# Patient Record
Sex: Female | Born: 1995 | Race: White | Hispanic: No | Marital: Single | Smoking: Current every day smoker
Health system: Southern US, Community
[De-identification: ages and names within clinical notes are randomized; demographics above are authoritative.]

## PROBLEM LIST (undated history)

## (undated) DIAGNOSIS — G43909 Migraine, unspecified, not intractable, without status migrainosus: Secondary | ICD-10-CM

## (undated) DIAGNOSIS — I10 Essential (primary) hypertension: Secondary | ICD-10-CM

## (undated) DIAGNOSIS — E669 Obesity, unspecified: Secondary | ICD-10-CM

---
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
## 2015-08-27 ENCOUNTER — Emergency Department: Payer: 59

## 2015-08-27 ENCOUNTER — Encounter: Payer: Self-pay | Admitting: Emergency Medicine

## 2015-08-27 ENCOUNTER — Emergency Department
Admission: EM | Admit: 2015-08-27 | Discharge: 2015-08-27 | Disposition: A | Discharge status: Home / Self Care | Payer: 59 | Attending: Emergency Medicine | Admitting: Emergency Medicine

## 2015-08-27 DIAGNOSIS — F17200 Nicotine dependence, unspecified, uncomplicated: Secondary | ICD-10-CM | POA: Diagnosis not present

## 2015-08-27 DIAGNOSIS — G43909 Migraine, unspecified, not intractable, without status migrainosus: Secondary | ICD-10-CM | POA: Diagnosis present

## 2015-08-27 DIAGNOSIS — F329 Major depressive disorder, single episode, unspecified: Secondary | ICD-10-CM | POA: Insufficient documentation

## 2015-08-27 DIAGNOSIS — R51 Headache: Secondary | ICD-10-CM | POA: Diagnosis not present

## 2015-08-27 DIAGNOSIS — R519 Headache, unspecified: Secondary | ICD-10-CM

## 2015-08-27 HISTORY — DX: Migraine, unspecified, not intractable, without status migrainosus: G43.909

## 2015-08-27 LAB — COMPREHENSIVE METABOLIC PANEL
ALBUMIN: 3.9 g/dL (ref 3.5–5.0)
ALK PHOS: 63 U/L (ref 38–126)
ALT: 23 U/L (ref 14–54)
ANION GAP: 6 (ref 5–15)
AST: 19 U/L (ref 15–41)
BILIRUBIN TOTAL: 0.3 mg/dL (ref 0.3–1.2)
BUN: 14 mg/dL (ref 6–20)
CALCIUM: 9 mg/dL (ref 8.9–10.3)
CO2: 22 mmol/L (ref 22–32)
Chloride: 107 mmol/L (ref 101–111)
Creatinine, Ser: 0.66 mg/dL (ref 0.44–1.00)
GFR calc Af Amer: >60 mL/min (ref >60)
GFR calc non Af Amer: >60 mL/min (ref >60)
GLUCOSE: 90 mg/dL (ref 65–99)
POTASSIUM: 4.2 mmol/L (ref 3.5–5.1)
SODIUM: 135 mmol/L (ref 135–145)
TOTAL PROTEIN: 7.3 g/dL (ref 6.5–8.1)

## 2015-08-27 LAB — CBC
HEMATOCRIT: 43.1 % (ref 35.0–47.0)
HEMOGLOBIN: 15 g/dL (ref 12.0–16.0)
MCH: 28.3 pg (ref 26.0–34.0)
MCHC: 34.9 g/dL (ref 32.0–36.0)
MCV: 81 fL (ref 80.0–100.0)
Platelets: 416 10*3/uL (ref 150–440)
RBC: 5.32 MIL/uL — ABNORMAL HIGH (ref 3.80–5.20)
RDW: 14 % (ref 11.5–14.5)
WBC: 9.3 10*3/uL (ref 3.6–11.0)

## 2015-08-27 LAB — URINALYSIS COMPLETE WITH MICROSCOPIC (ARMC ONLY)
Bilirubin Urine: NEGATIVE
GLUCOSE, UA: NEGATIVE mg/dL
Hgb urine dipstick: NEGATIVE
Ketones, ur: NEGATIVE mg/dL
NITRITE: NEGATIVE
PROTEIN: NEGATIVE mg/dL
SPECIFIC GRAVITY, URINE: 1.019 (ref 1.005–1.030)
pH: 5 (ref 5.0–8.0)

## 2015-08-27 LAB — LIPASE, BLOOD: Lipase: 17 U/L (ref 11–51)

## 2015-08-27 LAB — POCT PREGNANCY, URINE: PREG TEST UR: NEGATIVE

## 2015-08-27 MED ORDER — METOCLOPRAMIDE HCL 5 MG/ML IJ SOLN
10.0000 mg | Freq: Once | INTRAMUSCULAR | Status: AC
  Administered 2015-08-27: 10 mg via INTRAVENOUS
  Filled 2015-08-27: qty 2

## 2015-08-27 MED ORDER — DIPHENHYDRAMINE HCL 50 MG/ML IJ SOLN
25.0000 mg | Freq: Once | INTRAMUSCULAR | Status: AC
  Administered 2015-08-27: 25 mg via INTRAVENOUS
  Filled 2015-08-27: qty 1

## 2015-08-27 MED ORDER — KETOROLAC TROMETHAMINE 30 MG/ML IJ SOLN
30.0000 mg | Freq: Once | INTRAMUSCULAR | Status: AC
  Administered 2015-08-27: 30 mg via INTRAVENOUS
  Filled 2015-08-27: qty 1

## 2015-08-27 MED ORDER — BUTALBITAL-APAP-CAFFEINE 50-325-40 MG PO TABS: 1.0000 | ORAL_TABLET | Freq: Four times a day (QID) | ORAL | Status: AC | PRN

## 2015-08-27 NOTE — ED Notes (Signed)
Patient is somnolent. Waiting for mother to come back to transport.

## 2015-08-27 NOTE — Discharge Instructions (Signed)

## 2015-08-27 NOTE — ED Provider Notes (Signed)
[facility] Emergency Department Provider Note        Time seen: ----------------------------------------- 8:18 PM on 08/27/2015 -----------------------------------------    I have reviewed the triage vital signs and the nursing notes.   HISTORY  Chief Complaint Migraine    HPI Margaret RodriguezDonna R Humphrey is a 20 y.o. female who presents to ER for migraine for the last 3 days. Patient has not been officially diagnosed migraines but states its normal for her to have a severe headache. Family and patient states she's been under a lot of stress, she works nights at [employer]. She has been vomiting for several days. She has taken ibuprofen without much relief. Currently she feels weak and dizzy.   Past Medical History  Diagnosis Date  . Migraines     There are no active problems to display for this patient.   History reviewed. No pertinent past surgical history.  Allergies Penicillins  Social History Social History  Substance Use Topics  . Smoking status: Current Every Day Smoker  . Smokeless tobacco: None  . Alcohol Use: No    Review of Systems Constitutional: Negative for fever. Eyes: Negative for visual changes. ENT: Negative for sore throat. Cardiovascular: Negative for chest pain. Respiratory: Negative for shortness of breath. Gastrointestinal: Negative for abdominal pain, Positive for vomiting Genitourinary: Negative for dysuria. Musculoskeletal: Negative for back pain. Skin: Negative for rash. Neurological: Positive for headache and weakness  10-point ROS otherwise negative.  ____________________________________________   PHYSICAL EXAM:  VITAL SIGNS: ED Triage Vitals  Enc Vitals Group     BP 08/27/15 1726 110/61 mmHg     Pulse Rate 08/27/15 1726 77     Resp 08/27/15 1726 18     Temp 08/27/15 1726 97.8 F (36.6 C)     Temp Source 08/27/15 1726 Oral     SpO2 08/27/15 1726 98 %     Weight 08/27/15 1726 213 lb (96.616 kg)     Height  08/27/15 1726 5\' 5"  (1.651 m)     Head Cir --      Peak Flow --      Pain Score 08/27/15 1554 8     Pain Loc --      Pain Edu? --      Excl. in GC? --     Constitutional: Alert and oriented. Well appearing and in no distress. Eyes: Conjunctivae are normal. PERRL. Normal extraocular movements.No photophobia ENT   Head: Normocephalic and atraumatic.   Nose: No congestion/rhinnorhea.   Mouth/Throat: Mucous membranes are moist.   Neck: No stridor. Cardiovascular: Normal rate, regular rhythm. No murmurs, rubs, or gallops. Respiratory: Normal respiratory effort without tachypnea nor retractions. Breath sounds are clear and equal bilaterally. No wheezes/rales/rhonchi. Gastrointestinal: Soft and nontender. Normal bowel sounds Musculoskeletal: Nontender with normal range of motion in all extremities. No lower extremity tenderness nor edema. Neurologic:  Normal speech and language. No gross focal neurologic deficits are appreciated.  Skin:  Skin is warm, dry and intact. No rash noted. Psychiatric: Depressed mood and affect ____________________________________________  ED COURSE:  Pertinent labs & imaging results that were available during my care of the patient were reviewed by me and considered in my medical decision making (see chart for details). Patient presents to ER with persistent posterior headache. I will check basic labs and imaging. She'll receive IV Toradol and Reglan. ____________________________________________    LABS (pertinent positives/negatives)  Labs Reviewed  CBC - Abnormal; Notable for the following:    RBC 5.32 (*)  All other components within normal limits  URINALYSIS COMPLETEWITH MICROSCOPIC ([facility] ONLY) - Abnormal; Notable for the following:    Color, Urine YELLOW (*)    APPearance CLOUDY (*)    Leukocytes, UA 2+ (*)    Bacteria, UA RARE (*)    Squamous Epithelial / LPF TOO NUMEROUS TO COUNT (*)    All other components within normal limits   LIPASE, BLOOD  COMPREHENSIVE METABOLIC PANEL  POC URINE PREG, ED  POCT PREGNANCY, URINE    RADIOLOGY Images were viewed by me  CT head is unremarkable  ____________________________________________  FINAL ASSESSMENT AND PLAN  Headache, depression  Plan: Patient with labs and imaging as dictated above. Patient likely with depressive symptoms or adjustment disorder with depressed mood. Her headache is currently improved, labs and workup are unremarkable. She is stable for outpatient follow-up.   Emily Filbert, MD   Note: This dictation was prepared with Dragon dictation. Any transcriptional errors that result from this process are unintentional   Emily Filbert, MD 08/27/15 2020

## 2015-08-27 NOTE — ED Notes (Signed)
Patient left for CT scan. 

## 2015-08-27 NOTE — ED Notes (Signed)
Pt unable to give urine sample at this time; pt has specimen cup. 

## 2015-08-27 NOTE — ED Notes (Signed)
Pt to ed with c/o "migraine" x 3 days  States 2 days of vomiting.  Reports she tried 600mg  IBu without relief for headache.  Pt reports now feeling weak and dizzy.

## 2015-09-29 ENCOUNTER — Encounter (HOSPITAL_COMMUNITY): Payer: Self-pay | Admitting: Emergency Medicine

## 2015-09-29 DIAGNOSIS — R112 Nausea with vomiting, unspecified: Secondary | ICD-10-CM | POA: Diagnosis not present

## 2015-09-29 DIAGNOSIS — F17200 Nicotine dependence, unspecified, uncomplicated: Secondary | ICD-10-CM | POA: Diagnosis not present

## 2015-09-29 DIAGNOSIS — G43909 Migraine, unspecified, not intractable, without status migrainosus: Secondary | ICD-10-CM | POA: Insufficient documentation

## 2015-09-29 DIAGNOSIS — R197 Diarrhea, unspecified: Secondary | ICD-10-CM | POA: Diagnosis not present

## 2015-09-29 DIAGNOSIS — I10 Essential (primary) hypertension: Secondary | ICD-10-CM | POA: Insufficient documentation

## 2015-09-29 LAB — BASIC METABOLIC PANEL
Anion gap: 8 (ref 5–15)
BUN: 6 mg/dL (ref 6–20)
CALCIUM: 9 mg/dL (ref 8.9–10.3)
CHLORIDE: 103 mmol/L (ref 101–111)
CO2: 23 mmol/L (ref 22–32)
CREATININE: 0.78 mg/dL (ref 0.44–1.00)
GFR calc non Af Amer: >60 mL/min (ref >60)
Glucose, Bld: 112 mg/dL — ABNORMAL HIGH (ref 65–99)
Potassium: 4 mmol/L (ref 3.5–5.1)
SODIUM: 134 mmol/L — AB (ref 135–145)

## 2015-09-29 LAB — CBC WITH DIFFERENTIAL/PLATELET
BASOS ABS: 0 10*3/uL (ref 0.0–0.1)
BASOS PCT: 0 %
EOS ABS: 0.1 10*3/uL (ref 0.0–0.7)
Eosinophils Relative: 1 %
HCT: 45.7 % (ref 36.0–46.0)
HEMOGLOBIN: 15 g/dL (ref 12.0–15.0)
Lymphocytes Relative: 22 %
Lymphs Abs: 2.7 10*3/uL (ref 0.7–4.0)
MCH: 27.5 pg (ref 26.0–34.0)
MCHC: 32.8 g/dL (ref 30.0–36.0)
MCV: 83.7 fL (ref 78.0–100.0)
MONOS PCT: 7 %
Monocytes Absolute: 0.8 10*3/uL (ref 0.1–1.0)
NEUTROS PCT: 70 %
Neutro Abs: 8.3 10*3/uL — ABNORMAL HIGH (ref 1.7–7.7)
Platelets: 422 10*3/uL — ABNORMAL HIGH (ref 150–400)
RBC: 5.46 MIL/uL — ABNORMAL HIGH (ref 3.87–5.11)
RDW: 13.2 % (ref 11.5–15.5)
WBC: 12 10*3/uL — ABNORMAL HIGH (ref 4.0–10.5)

## 2015-09-29 LAB — URINALYSIS, ROUTINE W REFLEX MICROSCOPIC
BILIRUBIN URINE: NEGATIVE
Glucose, UA: NEGATIVE mg/dL
Hgb urine dipstick: NEGATIVE
KETONES UR: NEGATIVE mg/dL
Leukocytes, UA: NEGATIVE
NITRITE: NEGATIVE
PROTEIN: NEGATIVE mg/dL
Specific Gravity, Urine: 1.013 (ref 1.005–1.030)
pH: 8 (ref 5.0–8.0)

## 2015-09-29 LAB — POC URINE PREG, ED: PREG TEST UR: NEGATIVE

## 2015-09-29 NOTE — ED Triage Notes (Signed)
Pt. reports migraine headache with emesis and photophobia onset Sunday , denies fever or chills.

## 2015-09-30 ENCOUNTER — Emergency Department (HOSPITAL_COMMUNITY)
Admission: EM | Admit: 2015-09-30 | Discharge: 2015-09-30 | Disposition: A | Discharge status: Home / Self Care | Payer: 59 | Attending: Emergency Medicine | Admitting: Emergency Medicine

## 2015-09-30 DIAGNOSIS — R197 Diarrhea, unspecified: Secondary | ICD-10-CM

## 2015-09-30 DIAGNOSIS — R112 Nausea with vomiting, unspecified: Secondary | ICD-10-CM

## 2015-09-30 DIAGNOSIS — G43809 Other migraine, not intractable, without status migrainosus: Secondary | ICD-10-CM

## 2015-09-30 HISTORY — DX: Essential (primary) hypertension: I10

## 2015-09-30 HISTORY — DX: Obesity, unspecified: E66.9

## 2015-09-30 MED ORDER — SODIUM CHLORIDE 0.9 % IV BOLUS (SEPSIS)
1000.0000 mL | Freq: Once | INTRAVENOUS | Status: AC
  Administered 2015-09-30: 1000 mL via INTRAVENOUS

## 2015-09-30 MED ORDER — DIPHENHYDRAMINE HCL 50 MG/ML IJ SOLN
25.0000 mg | Freq: Once | INTRAMUSCULAR | Status: AC
  Administered 2015-09-30: 25 mg via INTRAVENOUS
  Filled 2015-09-30: qty 1

## 2015-09-30 MED ORDER — IBUPROFEN 600 MG PO TABS: 600.0000 mg | ORAL_TABLET | Freq: Four times a day (QID) | ORAL | 0 refills | Status: AC | PRN

## 2015-09-30 MED ORDER — PROCHLORPERAZINE EDISYLATE 5 MG/ML IJ SOLN
10.0000 mg | Freq: Once | INTRAMUSCULAR | Status: AC
  Administered 2015-09-30: 10 mg via INTRAVENOUS
  Filled 2015-09-30: qty 2

## 2015-09-30 NOTE — ED Provider Notes (Signed)
[facility] Provider Note   CSN: [id number] Arrival date & time: 09/29/15  2019  By signing my name below, I, Phillis Haggis, attest that this documentation has been prepared under the direction and in the presence of Alvira Monday, MD. Electronically Signed: Phillis Haggis, ED Scribe. 09/30/15. 2:20 AM.  First Provider Contact:  None    History   Chief Complaint Chief Complaint  Patient presents with  . Emesis  . Migraine   The history is provided by the patient. No language interpreter was used.   HPI Comments: Margaret Humphrey is a 20 y.o. female who presents to the Emergency Department complaining of gradually worsening, frontal headache onset one day ago. Pt states that it feels like "someone is taking a sledgehammer to my head." She currently rates her pain 9/10. Pt reports associated nausea, left ear pain, diarrhea, abdominal pain onset 4 days ago, vomiting x14, sound sensitivity, and photophobia. Pt states that she fell in the shower and has some pain in her right ankle and knee. She states that she has had multiple episodes of her legs "giving out" for years, even without having a headache. Pt has a pus filled nodule to the left ear that is painful. She denies fever, chills, hitting head, LOC, speech difficulty, facial droop, numbness, or weakness.   Past Medical History:  Diagnosis Date  . Hypertension   . Migraines   . Obesity     There are no active problems to display for this patient.   History reviewed. No pertinent surgical history.  OB History    No data available       Home Medications    Prior to Admission medications   Medication Sig Start Date End Date Taking? Authorizing Provider  butalbital-acetaminophen-caffeine (FIORICET) 50-325-40 MG tablet Take 1-2 tablets by mouth every 6 (six) hours as needed for headache. Patient not taking: Reported on 09/30/2015 08/27/15 08/26/16  Emily Filbert, MD  ibuprofen (ADVIL,MOTRIN) 600 MG tablet Take 1  tablet (600 mg total) by mouth every 6 (six) hours as needed. 09/30/15   Alvira Monday, MD    Family History No family history on file.  Social History Social History  Substance Use Topics  . Smoking status: Current Every Day Smoker  . Smokeless tobacco: Not on file  . Alcohol use No     Allergies   Strawberry (diagnostic) and Other   Review of Systems Review of Systems  Constitutional: Positive for diaphoresis. Negative for chills and fever.  HENT: Positive for ear pain. Negative for sore throat.   Eyes: Positive for photophobia. Negative for visual disturbance.  Respiratory: Negative for cough and shortness of breath.   Cardiovascular: Negative for chest pain.  Gastrointestinal: Positive for diarrhea, nausea and vomiting. Negative for abdominal pain.  Genitourinary: Negative for difficulty urinating.  Musculoskeletal: Negative for back pain and neck pain.  Skin: Negative for rash.  Neurological: Positive for headaches. Negative for syncope, facial asymmetry, speech difficulty, weakness and numbness.  All other systems reviewed and are negative.    Physical Exam Updated Vital Signs BP 99/61   Pulse 60   Temp 98.7 F (37.1 C) (Oral)   Resp 16   LMP 09/08/2015   SpO2 96%   Physical Exam  Constitutional: She is oriented to person, place, and time. She appears well-developed and well-nourished. No distress.  HENT:  Head: Normocephalic and atraumatic.  Eyes: Conjunctivae and EOM are normal. Pupils are equal, round, and reactive to light.  Neck: Normal range of  motion. Neck supple.  Cardiovascular: Normal rate, regular rhythm, normal heart sounds and intact distal pulses.  Exam reveals no gallop and no friction rub.   No murmur heard. Pulmonary/Chest: Effort normal and breath sounds normal. No respiratory distress. She has no wheezes. She has no rales.  Abdominal: Soft. She exhibits no distension. There is no tenderness. There is no guarding.  Musculoskeletal:  Normal range of motion. She exhibits no edema or tenderness.  Neurological: She is alert and oriented to person, place, and time. She has normal strength. No cranial nerve deficit or sensory deficit. Coordination normal. GCS eye subscore is 4. GCS verbal subscore is 5. GCS motor subscore is 6.  Skin: Skin is warm and dry. No rash noted. She is not diaphoretic. No erythema.  Psychiatric: She has a normal mood and affect. Her behavior is normal.  Nursing note and vitals reviewed.    ED Treatments / Results  DIAGNOSTIC STUDIES: Oxygen Saturation is 100% on RA, normal by my interpretation.    COORDINATION OF CARE: 2:19 AM-Discussed treatment plan which includes labs and migraine cocktail with pt at bedside and pt agreed to plan.    Labs (all labs ordered are listed, but only abnormal results are displayed) Labs Reviewed  CBC WITH DIFFERENTIAL/PLATELET - Abnormal; Notable for the following:       Result Value   WBC 12.0 (*)    RBC 5.46 (*)    Platelets 422 (*)    Neutro Abs 8.3 (*)    All other components within normal limits  BASIC METABOLIC PANEL - Abnormal; Notable for the following:    Sodium 134 (*)    Glucose, Bld 112 (*)    All other components within normal limits  URINALYSIS, ROUTINE W REFLEX MICROSCOPIC (NOT AT [facility])  POC URINE PREG, ED    EKG  EKG Interpretation None       Radiology No results found.  Procedures Procedures (including critical care time)  Medications Ordered in ED Medications  sodium chloride 0.9 % bolus 1,000 mL (0 mLs Intravenous Stopped 09/30/15 0341)  prochlorperazine (COMPAZINE) injection 10 mg (10 mg Intravenous Given 09/30/15 0208)  diphenhydrAMINE (BENADRYL) injection 25 mg (25 mg Intravenous Given 09/30/15 0208)     Initial Impression / Assessment and Plan / ED Course  I have reviewed the triage vital signs and the nursing notes.  Pertinent labs & imaging results that were available during my care of the patient were reviewed by me  and considered in my medical decision making (see chart for details).  Clinical Course   20yo female with history of headaches, suspected migraines presents with concern of headache.  Headache began slowly, no trauma, no fevers, and normal neurologic exam and have low suspicion for [facility], SDH or meningitis.  Patient was given compazine and benadryl with improvement in headache.  Also reports n/v/diarrhea with mom having same and suspect component of gastroenteritis. Pt also with pustule on ear, recommend warm compresses. Labs without significant abnormalities. Patient discharged in stable condition with understanding of reasons to return.    Final Clinical Impressions(s) / ED Diagnoses   Final diagnoses:  Other migraine without status migrainosus, not intractable  Non-intractable vomiting with nausea, vomiting of unspecified type  Diarrhea, unspecified type   I personally performed the services described in this documentation, which was scribed in my presence. The recorded information has been reviewed and is accurate.   New Prescriptions Discharge Medication List as of 09/30/2015  3:50 AM  Alvira MondayErin Brondon Wann, MD 09/30/15 1724

## 2017-04-02 ENCOUNTER — Other Ambulatory Visit: Payer: Self-pay

## 2017-04-02 ENCOUNTER — Ambulatory Visit (HOSPITAL_COMMUNITY)
Admission: EM | Admit: 2017-04-02 | Discharge: 2017-04-02 | Disposition: A | Discharge status: Home / Self Care | Payer: 59 | Attending: Family Medicine | Admitting: Family Medicine

## 2017-04-02 ENCOUNTER — Encounter (HOSPITAL_COMMUNITY): Payer: Self-pay | Admitting: Emergency Medicine

## 2017-04-02 DIAGNOSIS — R110 Nausea: Secondary | ICD-10-CM | POA: Diagnosis not present

## 2017-04-02 DIAGNOSIS — R519 Headache, unspecified: Secondary | ICD-10-CM

## 2017-04-02 DIAGNOSIS — F17210 Nicotine dependence, cigarettes, uncomplicated: Secondary | ICD-10-CM | POA: Diagnosis not present

## 2017-04-02 DIAGNOSIS — Z79899 Other long term (current) drug therapy: Secondary | ICD-10-CM | POA: Diagnosis not present

## 2017-04-02 DIAGNOSIS — J029 Acute pharyngitis, unspecified: Secondary | ICD-10-CM

## 2017-04-02 DIAGNOSIS — B349 Viral infection, unspecified: Secondary | ICD-10-CM

## 2017-04-02 DIAGNOSIS — R51 Headache: Secondary | ICD-10-CM | POA: Diagnosis not present

## 2017-04-02 LAB — POCT RAPID STREP A: STREPTOCOCCUS, GROUP A SCREEN (DIRECT): NEGATIVE

## 2017-04-02 MED ORDER — METOCLOPRAMIDE HCL 5 MG/ML IJ SOLN
5.0000 mg | Freq: Once | INTRAMUSCULAR | Status: AC
  Administered 2017-04-02: 5 mg via INTRAMUSCULAR

## 2017-04-02 MED ORDER — IPRATROPIUM BROMIDE 0.06 % NA SOLN: 2.0000 | Freq: Four times a day (QID) | NASAL | 0 refills | Status: AC

## 2017-04-02 MED ORDER — KETOROLAC TROMETHAMINE 60 MG/2ML IM SOLN
INTRAMUSCULAR | Status: AC
  Filled 2017-04-02: qty 2

## 2017-04-02 MED ORDER — DEXAMETHASONE SODIUM PHOSPHATE 10 MG/ML IJ SOLN
INTRAMUSCULAR | Status: AC
  Filled 2017-04-02: qty 1

## 2017-04-02 MED ORDER — FLUTICASONE PROPIONATE 50 MCG/ACT NA SUSP: 2.0000 | Freq: Every day | NASAL | 0 refills | Status: AC

## 2017-04-02 MED ORDER — BENZONATATE 100 MG PO CAPS: 100.0000 mg | ORAL_CAPSULE | Freq: Three times a day (TID) | ORAL | 0 refills | Status: AC

## 2017-04-02 MED ORDER — ONDANSETRON 4 MG PO TBDP: 4.0000 mg | ORAL_TABLET | Freq: Three times a day (TID) | ORAL | 0 refills | Status: AC | PRN

## 2017-04-02 MED ORDER — METOCLOPRAMIDE HCL 5 MG/ML IJ SOLN
INTRAMUSCULAR | Status: AC
  Filled 2017-04-02: qty 2

## 2017-04-02 MED ORDER — KETOROLAC TROMETHAMINE 60 MG/2ML IM SOLN
60.0000 mg | Freq: Once | INTRAMUSCULAR | Status: AC
  Administered 2017-04-02: 60 mg via INTRAMUSCULAR

## 2017-04-02 MED ORDER — DEXAMETHASONE SODIUM PHOSPHATE 10 MG/ML IJ SOLN
10.0000 mg | Freq: Once | INTRAMUSCULAR | Status: AC
  Administered 2017-04-02: 10 mg via INTRAMUSCULAR

## 2017-04-02 NOTE — ED Provider Notes (Signed)
[facility]    CSN: [id number] Arrival date & time: 04/02/17  1629     History   Chief Complaint Chief Complaint  Patient presents with  . Nausea  . Sore Throat  . Headache    HPI Margaret Humphrey is a 22 y.o. female.   22 year old female comes in with mother for 2-week history of left-sided headache, as well as 3-day history of URI symptoms.  Headache has sharp pain behind the left eye and left neck, and has poor sitting pain throughout the head on the left side.  She has regular, productive cough, rhinorrhea, nasal congestion for the past 3 days.  Tactile fever.  OTC naproxen 220 mg, Advil Cold and Sinus without relief.  Current every day smoker, 5-6cigs/day, has been smoking since she was 13, at one point was smoking 1 pack/day for several years.       Past Medical History:  Diagnosis Date  . Hypertension   . Migraines   . Obesity     There are no active problems to display for this patient.   History reviewed. No pertinent surgical history.  OB History    No data available       Home Medications    Prior to Admission medications   Medication Sig Start Date End Date Taking? Authorizing Provider  ibuprofen (ADVIL,MOTRIN) 600 MG tablet Take 1 tablet (600 mg total) by mouth every 6 (six) hours as needed. 09/30/15  Yes Alvira Monday, MD  benzonatate (TESSALON) 100 MG capsule Take 1 capsule (100 mg total) by mouth every 8 (eight) hours. 04/02/17   Cathie Hoops, Sarahmarie Leavey V, PA-C  fluticasone (FLONASE) 50 MCG/ACT nasal spray Place 2 sprays into both nostrils daily. 04/02/17   Cathie Hoops, Worley Radermacher V, PA-C  ipratropium (ATROVENT) 0.06 % nasal spray Place 2 sprays into both nostrils 4 (four) times daily. 04/02/17   Cathie Hoops, Terrel Nesheiwat V, PA-C  ondansetron (ZOFRAN ODT) 4 MG disintegrating tablet Take 1 tablet (4 mg total) by mouth every 8 (eight) hours as needed for nausea or vomiting. 04/02/17   Belinda Fisher, PA-C    Family History History reviewed. No pertinent family history.  Social History Social  History   Tobacco Use  . Smoking status: Current Every Day Smoker    Packs/day: 0.50    Types: Cigarettes  . Smokeless tobacco: Never Used  Substance Use Topics  . Alcohol use: No  . Drug use: No     Allergies   Strawberry (diagnostic) and Other   Review of Systems Review of Systems  Reason unable to perform ROS: See HPI as above.     Physical Exam Triage Vital Signs ED Triage Vitals  Enc Vitals Group     BP 04/02/17 1716 109/62     Pulse Rate 04/02/17 1716 (!) 116     Resp --      Temp 04/02/17 1716 99.9 F (37.7 C)     Temp Source 04/02/17 1716 Oral     SpO2 04/02/17 1716 98 %     Weight --      Height --      Head Circumference --      Peak Flow --      Pain Score 04/02/17 1713 8     Pain Loc --      Pain Edu? --      Excl. in GC? --    No data found.  Updated Vital Signs BP 109/62 (BP Location: Left Arm)   Pulse (!) 116  Temp 99.9 F (37.7 C) (Oral)   LMP 03/05/2017 (Approximate)   SpO2 98%   Physical Exam  Constitutional: She is oriented to person, place, and time. She appears well-developed and well-nourished. No distress.  HENT:  Head: Normocephalic and atraumatic.  Right Ear: External ear and ear canal normal. Tympanic membrane is erythematous. Tympanic membrane is not bulging.  Left Ear: External ear and ear canal normal. Tympanic membrane is erythematous. Tympanic membrane is not bulging.  Nose: Mucosal edema and rhinorrhea present. Right sinus exhibits maxillary sinus tenderness and frontal sinus tenderness. Left sinus exhibits maxillary sinus tenderness and frontal sinus tenderness.  Mouth/Throat: Uvula is midline and mucous membranes are normal. Posterior oropharyngeal erythema present. No tonsillar exudate.  Eyes: Conjunctivae and EOM are normal. Pupils are equal, round, and reactive to light.  Neck: Normal range of motion. Neck supple.  Cardiovascular: Normal rate, regular rhythm and normal heart sounds. Exam reveals no gallop and no  friction rub.  No murmur heard. Pulmonary/Chest: Effort normal and breath sounds normal. She has no decreased breath sounds. She has no wheezes. She has no rhonchi. She has no rales.  Lymphadenopathy:    She has no cervical adenopathy.  Neurological: She is alert and oriented to person, place, and time. She has normal strength. She is not disoriented. No cranial nerve deficit or sensory deficit. She displays a negative Romberg sign. Coordination and gait normal. GCS eye subscore is 4. GCS verbal subscore is 5. GCS motor subscore is 6.  Normal rapid movement, finger to nose.  Skin: Skin is warm and dry.  Psychiatric: She has a normal mood and affect. Her behavior is normal. Judgment normal.    UC Treatments / Results  Labs (all labs ordered are listed, but only abnormal results are displayed) Labs Reviewed  CULTURE, GROUP A STREP [facility])  POCT RAPID STREP A    EKG  EKG Interpretation None       Radiology No results found.  Procedures Procedures (including critical care time)  Medications Ordered in UC Medications  ketorolac (TORADOL) injection 60 mg (60 mg Intramuscular Given 04/02/17 1812)  metoCLOPramide (REGLAN) injection 5 mg (5 mg Intramuscular Given 04/02/17 1812)  dexamethasone (DECADRON) injection 10 mg (10 mg Intramuscular Given 04/02/17 1812)     Initial Impression / Assessment and Plan / UC Course  I have reviewed the triage vital signs and the nursing notes.  Pertinent labs & imaging results that were available during my care of the patient were reviewed by me and considered in my medical decision making (see chart for details).    Discussed with patient history and exam most consistent with viral URI. Symptomatic treatment as needed. Push fluids. Return precautions given.   Final Clinical Impressions(s) / UC Diagnoses   Final diagnoses:  Viral illness  Acute intractable headache, unspecified headache type    ED Discharge Orders        Ordered     benzonatate (TESSALON) 100 MG capsule  Every 8 hours     04/02/17 1821    fluticasone (FLONASE) 50 MCG/ACT nasal spray  Daily     04/02/17 1821    ipratropium (ATROVENT) 0.06 % nasal spray  4 times daily     04/02/17 1821    ondansetron (ZOFRAN ODT) 4 MG disintegrating tablet  Every 8 hours PRN     04/02/17 1821        Belinda FisherYu, Preslyn Warr V, PA-C 04/02/17 2041

## 2017-04-02 NOTE — ED Triage Notes (Signed)
Pt reports headache for 2 weeks.  She also reports nausea and sore throat x3 days with a possible fever.

## 2017-04-02 NOTE — Discharge Instructions (Signed)
Toradol, Decadron, Reglan injection in office today.  Rapid strep negative. Zofran for nausea/vomiting. Tessalon for cough. Start flonase, atrovent nasal spray for nasal congestion/drainage. You can use over the counter nasal saline rinse such as neti pot for nasal congestion. Keep hydrated, your urine should be clear to pale yellow in color. Tylenol/motrin for fever and pain. Monitor for any worsening of symptoms, chest pain, shortness of breath, wheezing, swelling of the throat, follow up for reevaluation.   For sore throat try using a honey-based tea. Use 3 teaspoons of honey with juice squeezed from half lemon. Place shaved pieces of ginger into 1/2-1 cup of water and warm over stove top. Then mix the ingredients and repeat every 4 hours as needed.

## 2017-04-04 LAB — CULTURE, GROUP A STREP (THRC)

## 2017-09-27 ENCOUNTER — Ambulatory Visit (HOSPITAL_COMMUNITY)
Admission: EM | Admit: 2017-09-27 | Discharge: 2017-09-27 | Disposition: A | Discharge status: Home / Self Care | Payer: 59 | Attending: Family Medicine | Admitting: Family Medicine

## 2017-09-27 ENCOUNTER — Encounter (HOSPITAL_COMMUNITY): Payer: Self-pay

## 2017-09-27 DIAGNOSIS — H00025 Hordeolum internum left lower eyelid: Secondary | ICD-10-CM | POA: Diagnosis not present

## 2017-09-27 MED ORDER — CEPHALEXIN 500 MG PO CAPS: 500.0000 mg | ORAL_CAPSULE | Freq: Four times a day (QID) | ORAL | 0 refills | Status: AC

## 2017-09-27 NOTE — Discharge Instructions (Addendum)
Stye  -Warm compresses multiple times a day with massage afterward  - Do not wear contacts or make up  - Keep lid clean, may use baby shampoo diluted with water, gentle scrubing - Keflex  - Return if increasing, redness, pain or swelling, decreased vision.

## 2017-09-27 NOTE — ED Provider Notes (Signed)
[facility]    CSN: [id number] Arrival date & time: 09/27/17  1207     History   Chief Complaint Chief Complaint  Patient presents with  . Eye Problem    HPI Margaret Humphrey is a 22 y.o. female history of hypertension and migraines presenting today for evaluation of left eye irritation.  She notes that she has had swelling to her left lower lid for the past few days.  Is irritating her eye when she blinks.  Occasionally having drainage that causes blurriness, but otherwise denies any changes in vision.  Patient is a contact lens wearer, but has not worn these for the past 6 months.  Denies any eye pain.  She has not taken any medicines or applied anything to her lid.  HPI  Past Medical History:  Diagnosis Date  . Hypertension   . Migraines   . Obesity     There are no active problems to display for this patient.   History reviewed. No pertinent surgical history.  OB History   None      Home Medications    Prior to Admission medications   Medication Sig Start Date End Date Taking? Authorizing Provider  benzonatate (TESSALON) 100 MG capsule Take 1 capsule (100 mg total) by mouth every 8 (eight) hours. 04/02/17   Cathie Hoops, Amy V, PA-C  cephALEXin (KEFLEX) 500 MG capsule Take 1 capsule (500 mg total) by mouth 4 (four) times daily for 5 days. 09/27/17 10/02/17  Minnette Merida C, PA-C  fluticasone (FLONASE) 50 MCG/ACT nasal spray Place 2 sprays into both nostrils daily. 04/02/17   Cathie Hoops, Amy V, PA-C  ibuprofen (ADVIL,MOTRIN) 600 MG tablet Take 1 tablet (600 mg total) by mouth every 6 (six) hours as needed. 09/30/15   Alvira Monday, MD  ipratropium (ATROVENT) 0.06 % nasal spray Place 2 sprays into both nostrils 4 (four) times daily. 04/02/17   Cathie Hoops, Amy V, PA-C  ondansetron (ZOFRAN ODT) 4 MG disintegrating tablet Take 1 tablet (4 mg total) by mouth every 8 (eight) hours as needed for nausea or vomiting. 04/02/17   Belinda Fisher, PA-C    Family History History reviewed. No pertinent  family history.  Social History Social History   Tobacco Use  . Smoking status: Current Every Day Smoker    Packs/day: 0.50    Types: Cigarettes  . Smokeless tobacco: Never Used  Substance Use Topics  . Alcohol use: No  . Drug use: No     Allergies   Strawberry (diagnostic) and Other   Review of Systems Review of Systems  Constitutional: Negative for activity change, appetite change, chills, fatigue and fever.  HENT: Negative for congestion, ear pain, rhinorrhea, sinus pressure, sore throat and trouble swallowing.   Eyes: Negative for discharge and redness.       Lid swelling  Respiratory: Negative for cough, chest tightness and shortness of breath.   Cardiovascular: Negative for chest pain.  Gastrointestinal: Negative for abdominal pain, diarrhea, nausea and vomiting.  Musculoskeletal: Negative for myalgias.  Skin: Negative for rash.  Neurological: Negative for dizziness, light-headedness and headaches.     Physical Exam Triage Vital Signs ED Triage Vitals  Enc Vitals Group     BP 09/27/17 1239 129/80     Pulse Rate 09/27/17 1237 97     Resp 09/27/17 1237 20     Temp 09/27/17 1237 98.7 F (37.1 C)     Temp Source 09/27/17 1237 Temporal     SpO2 09/27/17 1237 97 %  Weight --      Height --      Head Circumference --      Peak Flow --      Pain Score --      Pain Loc --      Pain Edu? --      Excl. in GC? --    No data found.  Updated Vital Signs BP 129/80   Pulse 97   Temp 98.7 F (37.1 C) (Temporal)   Resp 20   LMP  (LMP Unknown)   SpO2 97%   Visual Acuity Right Eye Distance:   Left Eye Distance:   Bilateral Distance:    Right Eye Near:   Left Eye Near:    Bilateral Near:     Physical Exam  Constitutional: She is oriented to person, place, and time. She appears well-developed and well-nourished.  No acute distress  HENT:  Head: Normocephalic and atraumatic.  Nose: Nose normal.  Eyes: Pupils are equal, round, and reactive to light.  Conjunctivae and EOM are normal.  None erythematous conjunctiva, left lower medial lid with erythema and swelling, head on internal aspect of lower lid, no discharge expressed.  Neck: Neck supple.  Cardiovascular: Normal rate.  Pulmonary/Chest: Effort normal. No respiratory distress.  Abdominal: She exhibits no distension.  Musculoskeletal: Normal range of motion.  Neurological: She is alert and oriented to person, place, and time.  Skin: Skin is warm and dry.  Psychiatric: She has a normal mood and affect.  Nursing note and vitals reviewed.     UC Treatments / Results  Labs (all labs ordered are listed, but only abnormal results are displayed) Labs Reviewed - No data to display  EKG None  Radiology No results found.  Procedures Procedures (including critical care time)  Medications Ordered in UC Medications - No data to display  Initial Impression / Assessment and Plan / UC Course  I have reviewed the triage vital signs and the nursing notes.  Pertinent labs & imaging results that were available during my care of the patient were reviewed by me and considered in my medical decision making (see chart for details).     Patient with hordeolum.  Will provide Keflex for 5 days, lid scrubs, compresses.Discussed strict return precautions. Patient verbalized understanding and is agreeable with plan.  Final Clinical Impressions(s) / UC Diagnoses   Final diagnoses:  Hordeolum internum of left lower eyelid     Discharge Instructions     Stye  -Warm compresses multiple times a day with massage afterward  - Do not wear contacts or make up  - Keep lid clean, may use baby shampoo diluted with water, gentle scrubing - Keflex  - Return if increasing, redness, pain or swelling, decreased vision.     ED Prescriptions    Medication Sig Dispense Auth. Provider   cephALEXin (KEFLEX) 500 MG capsule Take 1 capsule (500 mg total) by mouth 4 (four) times daily for 5 days. 20  capsule Alexy Heldt C, PA-C     Controlled Substance Prescriptions  Controlled Substance Registry consulted? Not Applicable   Lew DawesWieters, Magon Croson C, [place]PA-C 09/27/17 1305

## 2017-09-27 NOTE — ED Triage Notes (Signed)
Pt presents with left eye irritation. 

## 2017-11-12 IMAGING — CT CT HEAD W/O CM
3 series · 15 of 45 positions shown, 18 images · non-contrast
Comparison: None.

CLINICAL DATA: Migraine for 3 days.  Vomiting.

EXAM:
CT HEAD WITHOUT CONTRAST
TECHNIQUE: Contiguous axial images were obtained from the base of the skull
through the vertex without intravenous contrast.

[Series 2: head wo · axial · 0.47mm/px · z∈[-154,-39]mm · 9 of 28 slices shown, 12 images]
[im 3/28  brain]
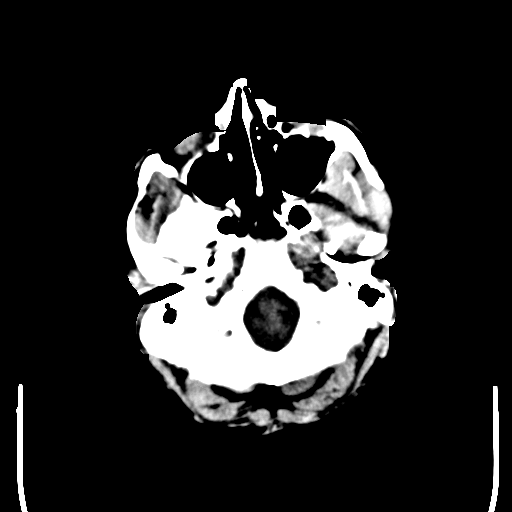
[im 3/28  bone]
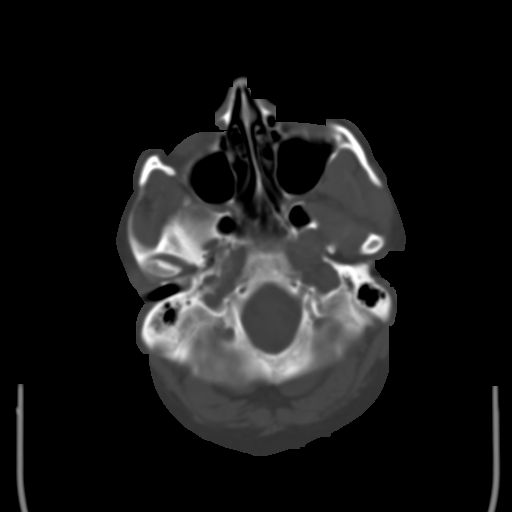
[im 6/28  brain]
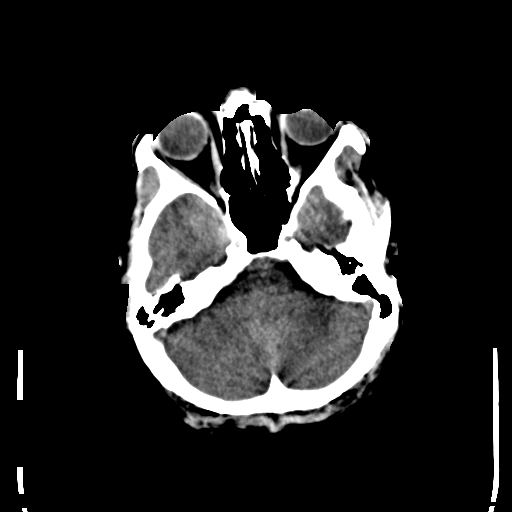
[im 9/28  brain]
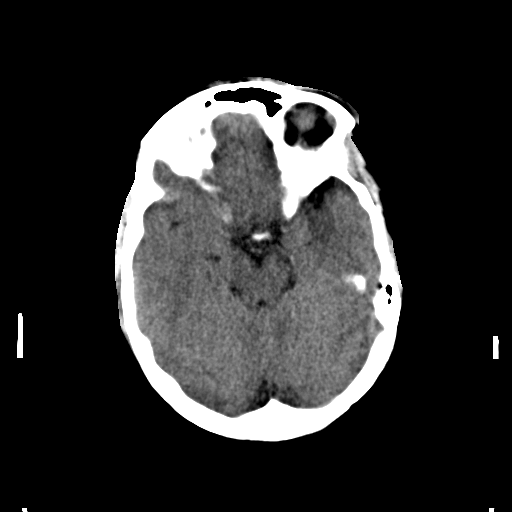
[im 12/28  brain]
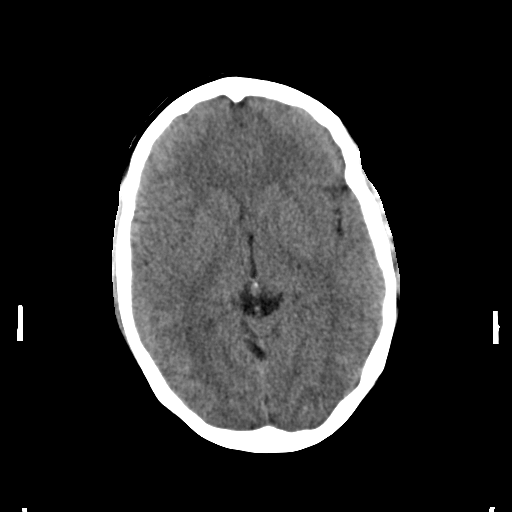
[im 15/28  brain]
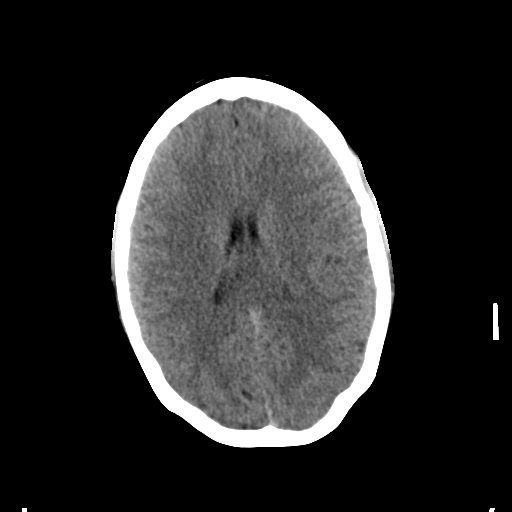
[im 15/28  bone]
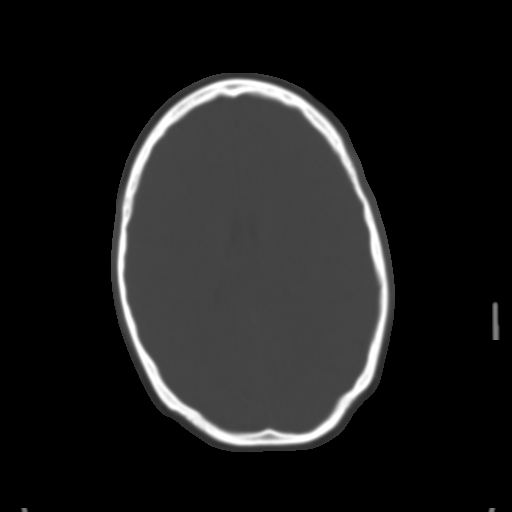
[im 17/28  brain]
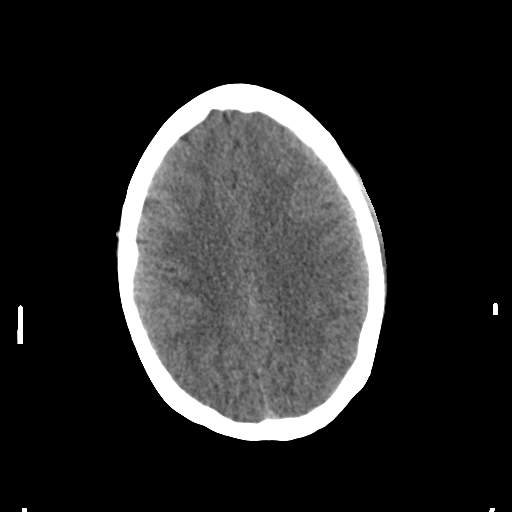
[im 20/28  brain]
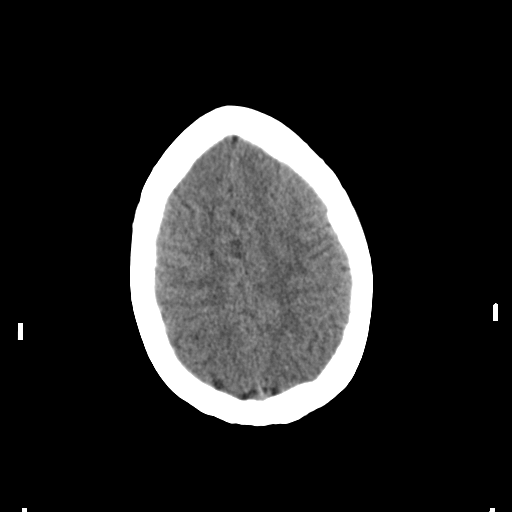
[im 23/28  brain]
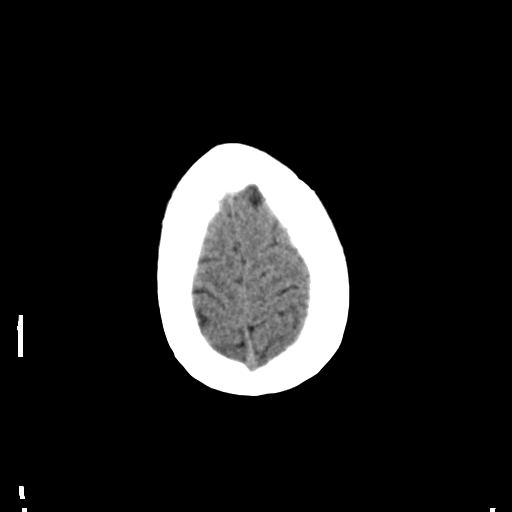
[im 26/28  brain]
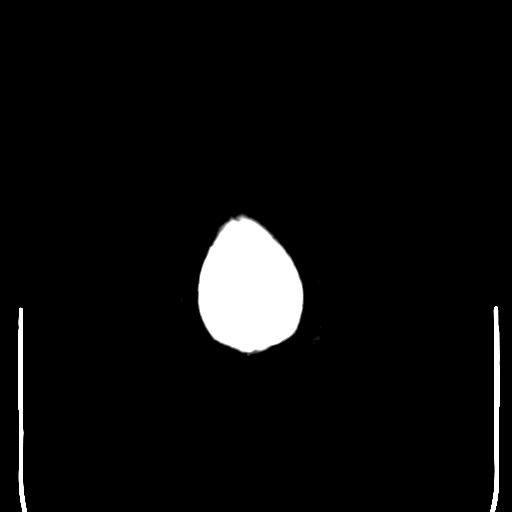
[im 26/28  bone]
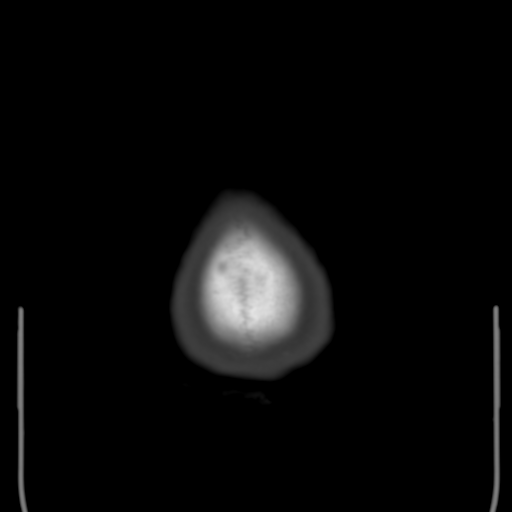

[Series 4: coronal soft · coronal · 0.27mm/px · 3 of 58 slices shown]
[im 20/58  brain]
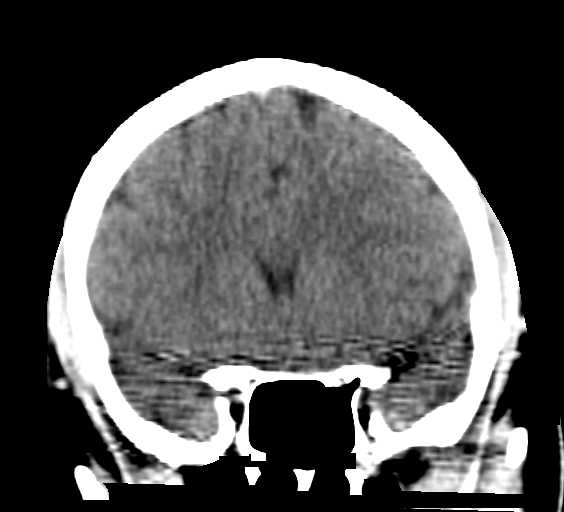
[im 26/58  brain]
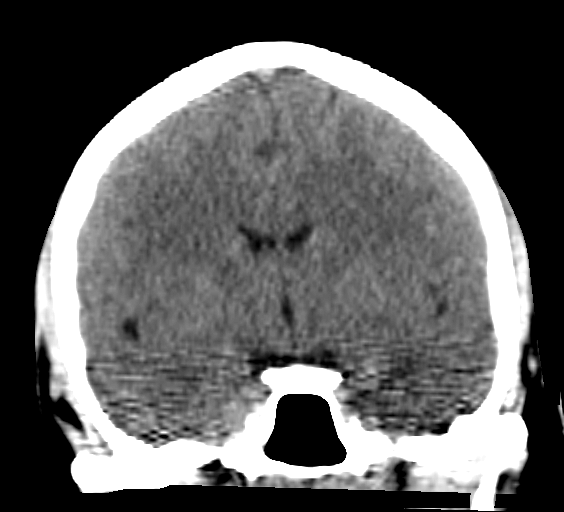
[im 32/58  brain]
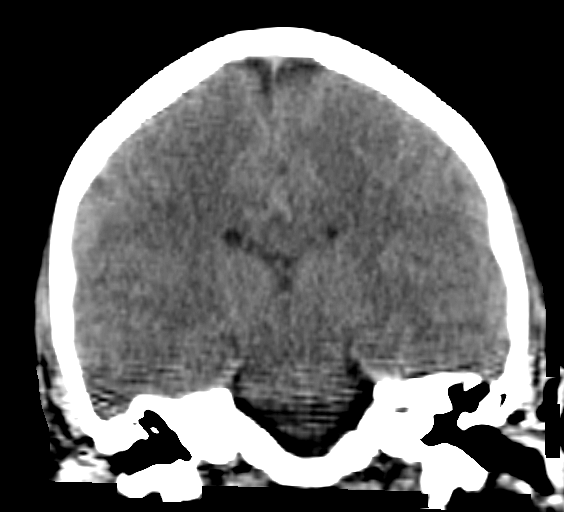

[Series 5: sagittal soft · sagittal · 0.29mm/px · 3 of 48 slices shown]
[im 16/48  brain]
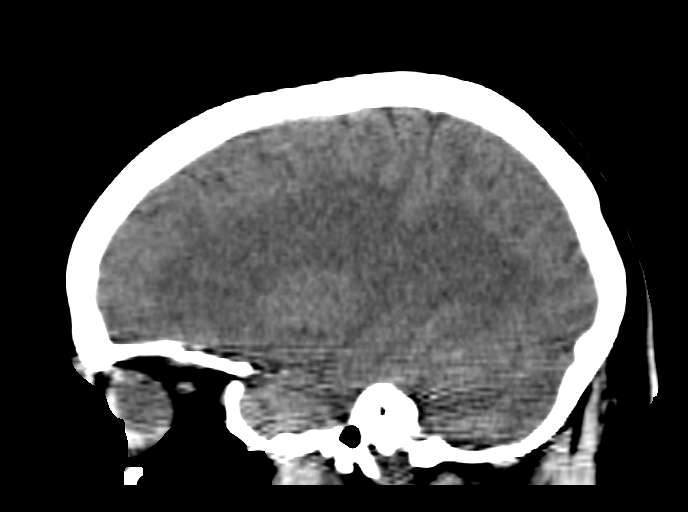
[im 24/48  brain]
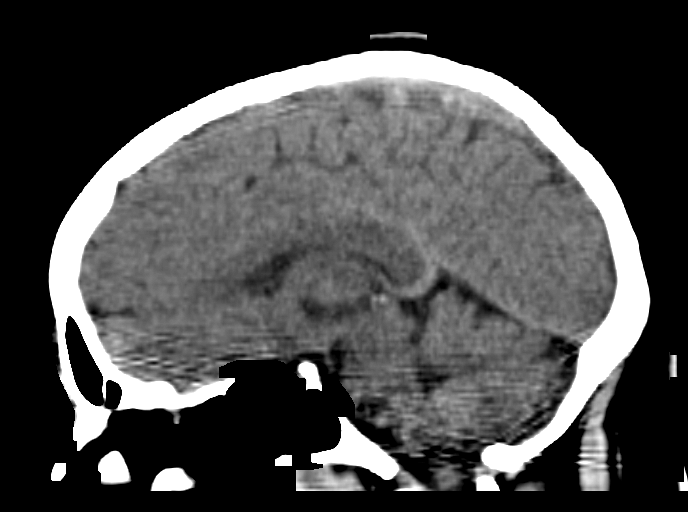
[im 32/48  brain]
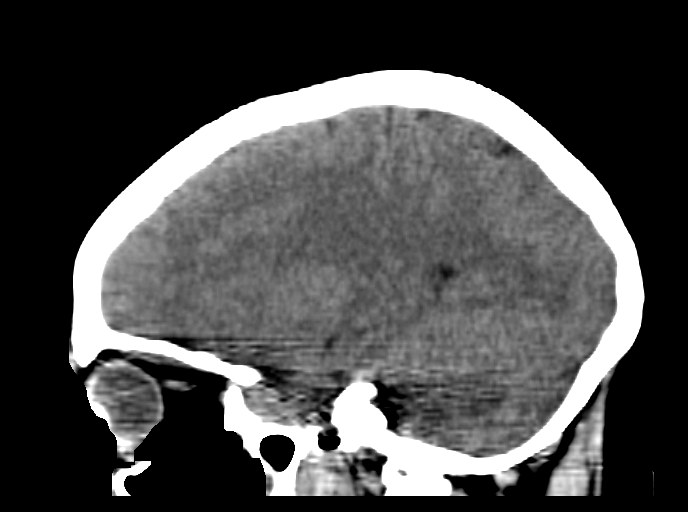

[15 of 45 positions shown; findings below may reference images not displayed]

FINDINGS: Paranasal sinuses, mastoid air cells, and bones are within normal
limits. Extracranial soft tissue are within normal limits. No
subdural, epidural, or subarachnoid hemorrhage. No mass, mass
effect, or midline shift. The cerebellum, brainstem, and basal
cisterns are within normal limits. Ventricles and sulci are
unremarkable. No acute cortical ischemia or infarct.
IMPRESSION: No acute abnormalities identified.

## 2021-05-02 ENCOUNTER — Encounter: Admit: 2021-05-02 | Discharge: 2021-05-02

## 2021-05-02 NOTE — Telephone Encounter
Attempted to contact patient to update PMH and complete the intake process, however she did not answer the phone. [facility] via [place] interpreter [phone] [facility]), awaiting call back.

## 2021-05-12 ENCOUNTER — Encounter: Admit: 2021-05-12 | Discharge: 2021-05-12

## 2021-05-12 NOTE — Telephone Encounter
Called and spoke with patient. Attempted to update PMH and complete the intake process, however patient declined. Patient states that she will need to cancel the upcoming appt for IPV, as she is receiving care at a different facility. Advised that RN will cancel her appt at this time. Patient verbalized understanding. Spanish interpreter [phone] Victorino Dike) used for this call, however patient speaks [place].

## 2022-06-06 ENCOUNTER — Encounter: Admit: 2022-06-06 | Discharge: 2022-06-06

## 2022-07-31 ENCOUNTER — Encounter: Admit: 2022-07-31 | Discharge: 2022-07-31
